# Patient Record
Sex: Female | Born: 1995 | Race: White | Hispanic: No | Marital: Single | Smoking: Never smoker
Health system: Southern US, Community
[De-identification: ages and names within clinical notes are randomized; demographics above are authoritative.]

## PROBLEM LIST (undated history)

## (undated) DIAGNOSIS — F99 Mental disorder, not otherwise specified: Secondary | ICD-10-CM

## (undated) DIAGNOSIS — N189 Chronic kidney disease, unspecified: Secondary | ICD-10-CM

## (undated) HISTORY — DX: Chronic kidney disease, unspecified: N18.9

## (undated) HISTORY — DX: Mental disorder, not otherwise specified: F99

---
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
## 2016-08-30 ENCOUNTER — Ambulatory Visit: Payer: Self-pay | Admitting: Advanced Practice Midwife

## 2016-08-30 ENCOUNTER — Encounter: Payer: Self-pay | Admitting: Advanced Practice Midwife

## 2016-08-30 VITALS — BP 138/82 | HR 80 | Wt 158.0 lb

## 2016-08-30 DIAGNOSIS — N926 Irregular menstruation, unspecified: Secondary | ICD-10-CM | POA: Diagnosis not present

## 2016-08-30 DIAGNOSIS — Z113 Encounter for screening for infections with a predominantly sexual mode of transmission: Secondary | ICD-10-CM | POA: Diagnosis not present

## 2016-08-30 DIAGNOSIS — Z3202 Encounter for pregnancy test, result negative: Secondary | ICD-10-CM | POA: Diagnosis not present

## 2016-08-30 LAB — POCT URINE PREGNANCY: PREG TEST UR: NEGATIVE

## 2016-08-30 MED ORDER — MEDROXYPROGESTERONE ACETATE 10 MG PO TABS: 10.0000 mg | ORAL_TABLET | Freq: Every day | ORAL | 0 refills | Status: DC

## 2016-08-30 MED ORDER — NORETHIN-ETH ESTRAD-FE BIPHAS 1 MG-10 MCG / 10 MCG PO THPK: 1.0000 | PACK | Freq: Every day | ORAL | 11 refills | Status: DC

## 2016-08-30 NOTE — Progress Notes (Signed)
[facility] Visit  Patient name: Daisy BoehringerSavannah R Wisenbaker MRN [id number]  Date of birth: 1996-02-11  CC & HPI:  Daisy Perkins is a 20 y.o. Caucasian female presenting today for  Period management  Started LoLoestrin in July (for irregular periods) (w/PCP--had "lots of bloodwork, all normal)  only took it for 3 months because on the third month she did not have a withdrawal bleed and got scared.  Last bleeding was 5 weeks ago. Has not had sex in 3 months. Wants to go back on pill, but has new insurance and LoLoestrin is not on formulary. Offered Pg challenge to jumpstart period and pt accepts.   Pertinent History Reviewed:  Medical & Surgical Hx:   Past Medical History:  Diagnosis Date  . Chronic kidney disease   . Mental disorder    anxiety   History reviewed. No pertinent surgical history. History reviewed. No pertinent family history.  Current Outpatient Prescriptions:  .  escitalopram (LEXAPRO) 10 MG tablet, Take 10 mg by mouth daily., Disp: , Rfl:  .  medroxyPROGESTERone (PROVERA) 10 MG tablet, Take 1 tablet (10 mg total) by mouth daily., Disp: 5 tablet, Rfl: 0 .  Norethin-Eth Estrad-Fe Biphas 1 MG-10 MCG / 10 MCG THPK, Take 1 tablet by mouth daily., Disp: 28 each, Rfl: 11 Social History: Reviewed -  reports that she has never smoked. She has never used smokeless tobacco.  Review of Systems:    Constitutional: Negative for fever and chills Eyes: Negative for visual disturbances Respiratory: Negative for shortness of breath, dyspnea Cardiovascular: Negative for chest pain or palpitations  Gastrointestinal: Negative for vomiting, diarrhea and constipation; no abdominal pain Genitourinary: Negative for dysuria and urgency, vaginal irritation or itching Musculoskeletal: Negative for back pain, joint pain, myalgias  Neurological: Negative for dizziness and headaches    Objective Findings:  Vitals: BP 138/82 (BP Location: Right Arm, Patient Position: Sitting, Cuff Size:  Normal)   Pulse 80   Wt 158 lb (71.7 kg)   LMP 07/23/2016   Physical Examination: General appearance - well appearing, and in no distress Mental status - alert, oriented to person, place, and time Chest:  Normal respiratory effort Heart - normal rate and regular rhythm Abdomen:  Soft, nontender Musculoskeletal:  Normal range of motion without pain Extremities:  No edema  No results found for this or any previous visit (from the past 24 hour(s)).        Assessment & Plan:  A:   Irregular menses P:  Provera 10mg  X 5; start COC w/bleed.   Meds ordered this encounter  Medications  . DISCONTD: Norethindrone-Ethinyl Estradiol-Fe Biphas (LO LOESTRIN FE) 1 MG-10 MCG / 10 MCG tablet    Sig: Take 1 tablet by mouth daily.  Marland Kitchen. escitalopram (LEXAPRO) 10 MG tablet    Sig: Take 10 mg by mouth daily.  Kathrynn Running. Norethin-Eth Estrad-Fe Biphas 1 MG-10 MCG / 10 MCG THPK    Sig: Take 1 tablet by mouth daily.    Dispense:  28 each    Refill:  11    Order Specific Question:   Supervising Provider    Answer:   Despina HiddenEURE, LUTHER H [[id number]]  . medroxyPROGESTERone (PROVERA) 10 MG tablet    Sig: Take 1 tablet (10 mg total) by mouth daily.    Dispense:  5 tablet    Refill:  0    Order Specific Question:   Supervising Provider    Answer:   Duane LopeEURE, LUTHER H [[id number]]    check CHL/GC  Return in about 3 months (around 11/30/2016) for med check.   CRESENZO-DISHMAN,Antonella Upson CNM 08/30/2016 4:12 PM

## 2016-08-31 LAB — GC/CHLAMYDIA PROBE AMP
CHLAMYDIA, DNA PROBE: NEGATIVE
NEISSERIA GONORRHOEAE BY PCR: NEGATIVE

## 2016-11-10 ENCOUNTER — Other Ambulatory Visit: Payer: Self-pay | Admitting: *Deleted

## 2016-11-13 ENCOUNTER — Other Ambulatory Visit: Payer: Self-pay | Admitting: *Deleted

## 2016-11-15 ENCOUNTER — Other Ambulatory Visit: Payer: Self-pay | Admitting: Advanced Practice Midwife

## 2016-11-15 MED ORDER — NORETHIN-ETH ESTRAD-FE BIPHAS 1 MG-10 MCG / 10 MCG PO THPK: 1.0000 | PACK | Freq: Every day | ORAL | 11 refills | Status: DC

## 2016-11-15 MED ORDER — NORETHIN-ETH ESTRAD-FE BIPHAS 1 MG-10 MCG / 10 MCG PO TABS: 1.0000 | ORAL_TABLET | Freq: Every day | ORAL | 3 refills | Status: DC

## 2016-11-23 ENCOUNTER — Encounter: Payer: Self-pay | Admitting: Advanced Practice Midwife

## 2016-11-30 ENCOUNTER — Ambulatory Visit: Payer: Managed Care, Other (non HMO) | Admitting: Advanced Practice Midwife

## 2016-12-07 ENCOUNTER — Ambulatory Visit: Payer: Managed Care, Other (non HMO) | Admitting: Advanced Practice Midwife

## 2017-04-02 ENCOUNTER — Encounter: Payer: Self-pay | Admitting: Advanced Practice Midwife

## 2017-04-10 ENCOUNTER — Ambulatory Visit (INDEPENDENT_AMBULATORY_CARE_PROVIDER_SITE_OTHER): Payer: Commercial Managed Care - PPO | Admitting: Advanced Practice Midwife

## 2017-04-10 ENCOUNTER — Encounter: Payer: Self-pay | Admitting: Advanced Practice Midwife

## 2017-04-10 VITALS — BP 120/72 | HR 82 | Ht 64.0 in | Wt 151.0 lb

## 2017-04-10 DIAGNOSIS — N921 Excessive and frequent menstruation with irregular cycle: Secondary | ICD-10-CM

## 2017-04-10 MED ORDER — NORGESTIM-ETH ESTRAD TRIPHASIC 0.18/0.215/0.25 MG-25 MCG PO TABS: 1.0000 | ORAL_TABLET | Freq: Every day | ORAL | 3 refills | Status: DC

## 2017-04-10 NOTE — Progress Notes (Signed)
[facility] Visit  Patient name: Daisy Perkins MRN [id number]  Date of birth: October 23, 1996  CC & HPI:  Daisy Perkins is a 21 y.o.  female presenting today for bleeding after intercourse.  She also has bleeding in between placebo pills, spotting.  No change in discharge On LoLoestrin Pertinent History Reviewed:  Medical & Surgical Hx:   Past Medical History:  Diagnosis Date  . Chronic kidney disease   . Mental disorder    anxiety   History reviewed. No pertinent surgical history. Family History  Problem Relation Age of Onset  . Cancer Paternal Grandfather        lung  . Cancer Paternal Grandmother        rectal  . Cancer Maternal Grandmother        ovarian  . Cancer Maternal Grandfather        lung  . Hypertension Mother     Current Outpatient Prescriptions:  .  ALPRAZolam (XANAX) 0.25 MG tablet, TAKE 1 TABLET BY MOUTH ONCE DAILY AS NEEDED FOR PANIC ATTACKS, Disp: , Rfl: 0 .  amphetamine-dextroamphetamine (ADDERALL) 10 MG tablet, Take 10 mg by mouth 2 (two) times daily., Disp: , Rfl: 0 .  escitalopram (LEXAPRO) 10 MG tablet, Take 10 mg by mouth daily., Disp: , Rfl:  .  Norgestimate-Ethinyl Estradiol Triphasic 0.18/0.215/0.25 MG-25 MCG tab, Take 1 tablet by mouth daily., Disp: 3 Package, Rfl: 3 Social History: Reviewed -  reports that she has never smoked. She has never used smokeless tobacco.  Review of Systems:   Constitutional: Negative for fever and chills Eyes: Negative for visual disturbances Respiratory: Negative for shortness of breath, dyspnea Cardiovascular: Negative for chest pain or palpitations  Gastrointestinal: Negative for vomiting, diarrhea and constipation; no abdominal pain Genitourinary: Negative for dysuria and urgency, vaginal irritation or itching Musculoskeletal: Negative for back pain, joint pain, myalgias  Neurological: Negative for dizziness and headaches    Objective Findings:    Physical Examination: General appearance -  well appearing, and in no distress Mental status - alert, oriented to person, place, and time Chest:  Normal respiratory effort Heart - normal rate and regular rhythm Abdomen:  Soft, nontender Pelvic: SSE:  Menstrual appearing blood.  Cx non firable.  Wet prep neg Musculoskeletal:  Normal range of motion without pain Extremities:  No edema    No results found for this or any previous visit (from the past 24 hour(s)).    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  CRESENZO-DISHMAN,Ancelmo Hunt CNM 04/10/2017 9:13 AM

## 2017-11-30 ENCOUNTER — Other Ambulatory Visit: Payer: Self-pay | Admitting: *Deleted

## 2017-12-04 MED ORDER — NORGESTIM-ETH ESTRAD TRIPHASIC 0.18/0.215/0.25 MG-25 MCG PO TABS: 1.0000 | ORAL_TABLET | Freq: Every day | ORAL | 3 refills | Status: DC

## 2018-02-13 ENCOUNTER — Other Ambulatory Visit: Payer: Commercial Managed Care - PPO | Admitting: Advanced Practice Midwife

## 2018-04-10 ENCOUNTER — Telehealth: Payer: Self-pay | Admitting: *Deleted

## 2018-04-10 NOTE — Telephone Encounter (Signed)
LMOVM

## 2018-04-11 NOTE — Telephone Encounter (Signed)
LMOVM Returning patient's call.  

## 2018-04-19 ENCOUNTER — Telehealth: Payer: Self-pay | Admitting: *Deleted

## 2018-04-19 NOTE — Telephone Encounter (Signed)
Pt having BTB with OCs, continue those for now and make appt for pap and physical in July and can discuss possibly changing pills then

## 2018-04-19 NOTE — Telephone Encounter (Signed)
Pt requesting a prescription for a new type of birth control pill. Her current one causes her to bleed when she is not supposed to. She is requesting a 90 day rx because of her insurance. She is finishing up her current pack and would like to start a new pill when it is time for it. Advised patient I will sent message to a provider and let her know.

## 2018-05-14 ENCOUNTER — Encounter: Payer: Self-pay | Admitting: Adult Health

## 2018-05-14 ENCOUNTER — Ambulatory Visit (INDEPENDENT_AMBULATORY_CARE_PROVIDER_SITE_OTHER): Payer: Commercial Managed Care - PPO | Admitting: Adult Health

## 2018-05-14 ENCOUNTER — Other Ambulatory Visit (HOSPITAL_COMMUNITY)
Admission: RE | Admit: 2018-05-14 | Discharge: 2018-05-14 | Disposition: A | Discharge status: Home / Self Care | Payer: Commercial Managed Care - PPO | Source: Ambulatory Visit | Attending: Adult Health | Admitting: Adult Health

## 2018-05-14 VITALS — BP 140/90 | HR 102 | Ht 63.25 in | Wt 148.5 lb

## 2018-05-14 DIAGNOSIS — N926 Irregular menstruation, unspecified: Secondary | ICD-10-CM

## 2018-05-14 DIAGNOSIS — Z01419 Encounter for gynecological examination (general) (routine) without abnormal findings: Secondary | ICD-10-CM | POA: Diagnosis not present

## 2018-05-14 DIAGNOSIS — Z3041 Encounter for surveillance of contraceptive pills: Secondary | ICD-10-CM

## 2018-05-14 DIAGNOSIS — Z01411 Encounter for gynecological examination (general) (routine) with abnormal findings: Secondary | ICD-10-CM | POA: Insufficient documentation

## 2018-05-14 MED ORDER — NORETHIN ACE-ETH ESTRAD-FE 1-20 MG-MCG(24) PO CAPS: 1.0000 | ORAL_CAPSULE | Freq: Every day | ORAL | 0 refills | Status: DC

## 2018-05-14 NOTE — Progress Notes (Addendum)
Patient ID: Daisy Perkins, female   DOB: 08-27-1996, 22 y.o.   MRN: [id number] History of Present Illness: Daisy Perkins is a 22 year old white female in for a well woman gyn exam and first pap. [facility]CP [facility].    Current Medications, Allergies, Past Medical History, Past Surgical History, Family History and Social History were reviewed in [facility] electronic medical record.     Review of Systems: Patient denies any headaches, hearing loss, fatigue, blurred vision, shortness of breath, chest pain, abdominal pain, problems with bowel movements, urination, or intercourse. No joint pain or mood swings.Has anxiety and is on lexapro, and xanax  +BTB,+cramps with period too. Has tried to use tampons and after insertion, when walking, has fainted.    Physical Exam:BP (!) 142/91 (BP Location: Right Arm, Patient Position: Sitting, Cuff Size: Normal)   Pulse (!) 102   Ht 5' 3.25" (1.607 m)   Wt 148 lb 8 oz (67.4 kg)   LMP 05/07/2018   BMI 26.10 kg/m BP 140/90 on recheck  General:  Well developed, well nourished, no acute distress Skin:  Warm and dry Neck:  Midline trachea, normal thyroid, good ROM, no lymphadenopathy Lungs; Clear to auscultation bilaterally Breast:  No dominant palpable mass, retraction, or nipple discharge Cardiovascular: Regular rate and rhythm Abdomen:  Soft, non tender, no hepatosplenomegaly Pelvic:  External genitalia is normal in appearance, no lesions.  The vagina is normal in appearance. Urethra has no lesions or masses. The cervix is smooth, pap with GC/CHL performed.  Uterus is felt to be normal size, shape, and contour.  No adnexal masses or tenderness noted.Bladder is non tender, no masses felt. Extremities/musculoskeletal:  No swelling or varicosities noted, no clubbing or cyanosis Psych:  No mood changes, alert and cooperative,seems happy PHQ 2 score 0.   Impression:  1. Encounter for gynecological examination with Papanicolaou smear of cervix   2.  Irregular bleeding   3. Encounter for surveillance of contraceptive pills      Plan: Start [place] today, and use condoms Meds ordered this encounter  Medications  . Norethin Ace-Eth Estrad-FE (TAYTULLA) 1-20 MG-MCG(24) CAPS    Sig: Take 1 tablet by mouth daily.    Dispense:  84 capsule    Refill:  0    Order Specific Question:   Supervising Provider    Answer:   Despina HiddenEURE, LUTHER H [[id number]]  F/U in 10 weeks  Physical in 1 year Pap in 3 if normal

## 2018-05-15 LAB — CYTOLOGY - PAP
CHLAMYDIA, DNA PROBE: NEGATIVE
Diagnosis: NEGATIVE
NEISSERIA GONORRHEA: NEGATIVE

## 2018-07-23 ENCOUNTER — Ambulatory Visit: Payer: Commercial Managed Care - PPO | Admitting: Adult Health

## 2018-08-26 ENCOUNTER — Telehealth: Payer: Self-pay | Admitting: Adult Health

## 2018-08-26 MED ORDER — NORETHIN ACE-ETH ESTRAD-FE 1-20 MG-MCG(24) PO CAPS: 1.0000 | ORAL_CAPSULE | Freq: Every day | ORAL | 0 refills | Status: DC

## 2018-08-26 NOTE — Telephone Encounter (Signed)
Patient called, stated that she ran out of her Taytulla yesterday.  She scheduled an appointment with Victorino Dike for 09/24/18.  She is requesting some Taytulla to get her through to her appointment.  [facility] [place]  [phone]

## 2018-08-26 NOTE — Telephone Encounter (Signed)
Refill taytulla  

## 2018-08-28 ENCOUNTER — Other Ambulatory Visit: Payer: Self-pay | Admitting: Adult Health

## 2018-08-28 MED ORDER — NORETHIN ACE-ETH ESTRAD-FE 1-20 MG-MCG(24) PO CAPS: 1.0000 | ORAL_CAPSULE | Freq: Every day | ORAL | 0 refills | Status: DC

## 2018-08-28 NOTE — Progress Notes (Signed)
Refill taytulla  

## 2018-09-24 ENCOUNTER — Other Ambulatory Visit: Payer: Commercial Managed Care - PPO | Admitting: Adult Health

## 2018-10-29 ENCOUNTER — Other Ambulatory Visit: Payer: Self-pay | Admitting: Adult Health

## 2018-10-30 ENCOUNTER — Other Ambulatory Visit: Payer: Self-pay | Admitting: Adult Health

## 2018-11-08 ENCOUNTER — Telehealth: Payer: Self-pay | Admitting: Adult Health

## 2018-11-08 NOTE — Telephone Encounter (Signed)
Left message that I think pharmacy sent that for taytulla

## 2018-11-08 NOTE — Telephone Encounter (Signed)
Patient called, stated that Victorino DikeJennifer called in a bc that she's never had before and wants to know if Victorino DikeJennifer had a purpose behind it.    [facility] [place]  [phone]

## 2018-11-14 ENCOUNTER — Telehealth: Payer: Self-pay | Admitting: Adult Health

## 2018-11-14 MED ORDER — NORETHIN ACE-ETH ESTRAD-FE 1-20 MG-MCG(24) PO CAPS: 1.0000 | ORAL_CAPSULE | Freq: Every day | ORAL | 3 refills | Status: DC

## 2018-11-14 NOTE — Telephone Encounter (Signed)
Refill Taytulla

## 2018-11-14 NOTE — Telephone Encounter (Signed)
[facility] you please [facility] [place]

## 2019-07-23 ENCOUNTER — Other Ambulatory Visit: Payer: Self-pay | Admitting: Adult Health

## 2020-06-25 ENCOUNTER — Other Ambulatory Visit: Payer: Self-pay

## 2020-06-25 ENCOUNTER — Ambulatory Visit (HOSPITAL_COMMUNITY)
Admission: RE | Admit: 2020-06-25 | Discharge: 2020-06-25 | Disposition: A | Discharge status: Home / Self Care | Payer: Commercial Managed Care - PPO | Source: Ambulatory Visit | Attending: Internal Medicine | Admitting: Internal Medicine

## 2020-06-25 ENCOUNTER — Other Ambulatory Visit (HOSPITAL_COMMUNITY): Payer: Self-pay | Admitting: Internal Medicine

## 2020-06-25 DIAGNOSIS — M545 Low back pain, unspecified: Secondary | ICD-10-CM

## 2020-08-14 ENCOUNTER — Other Ambulatory Visit: Payer: Self-pay | Admitting: Adult Health

## 2020-11-07 ENCOUNTER — Other Ambulatory Visit: Payer: Self-pay | Admitting: Adult Health

## 2021-08-12 IMAGING — DX DG LUMBAR SPINE COMPLETE 4+V
5 series · 5 of 5 positions shown · non-contrast
Comparison: No pertinent prior exams are available for comparison.

CLINICAL DATA: Low back pain, unspecified back pain laterality,
unspecified chronicity, unspecified whether sciatica present.
Additional history provided by technologist: Patient reports pain in
lower back without known specific injury.

EXAM:
LUMBAR SPINE - COMPLETE 4+ VIEW

[l-spine ap]
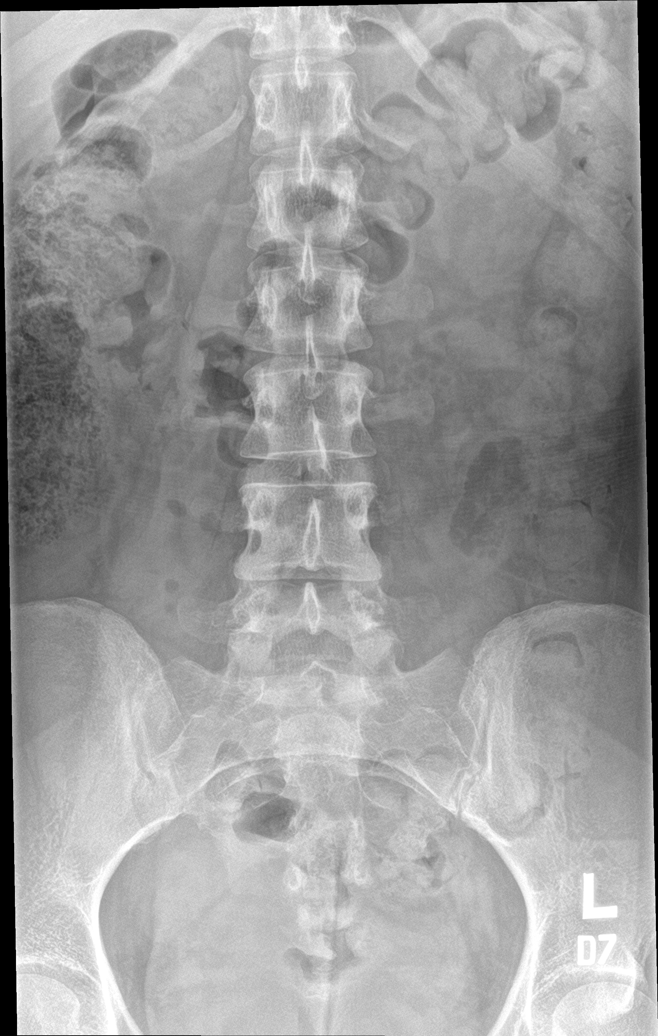

[l-spine obl (1 of 2)]
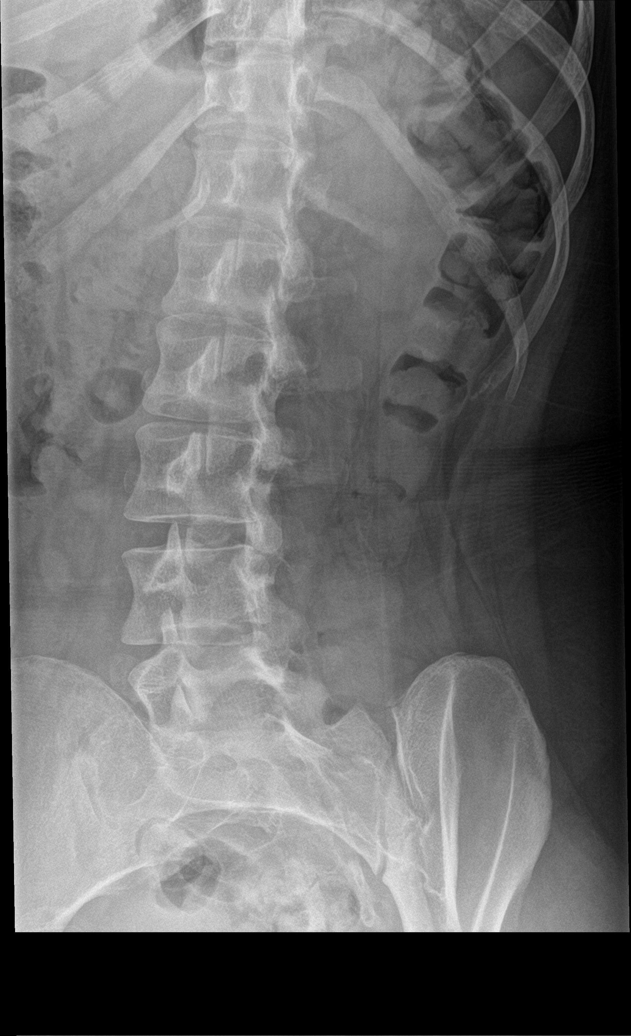

[l-spine obl (2 of 2)]
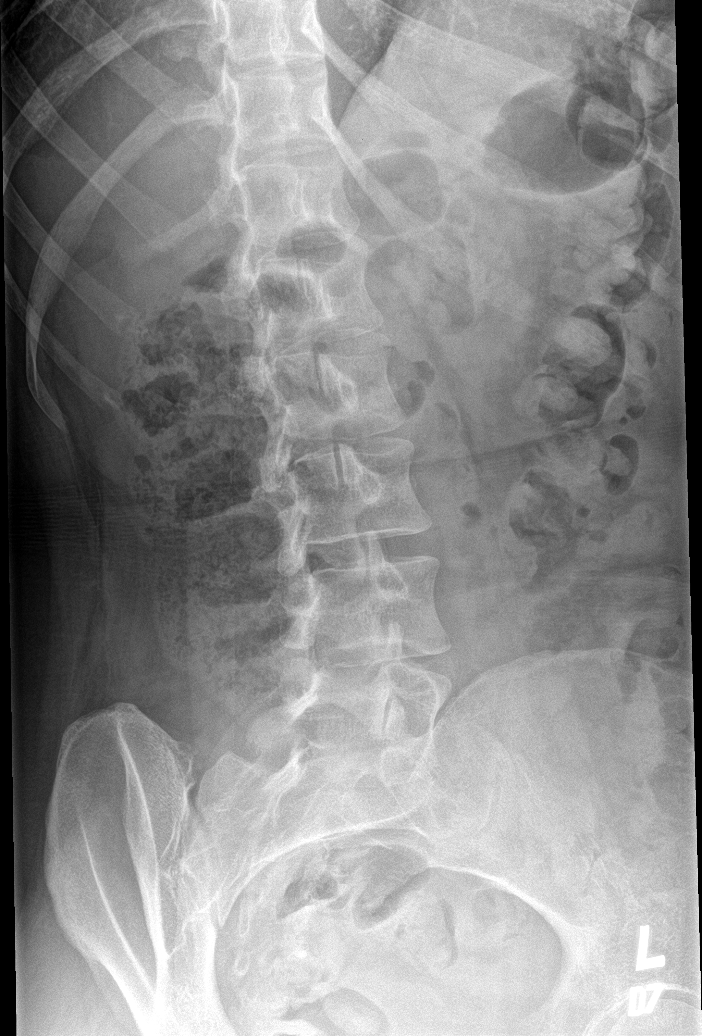

[l-spine lat]
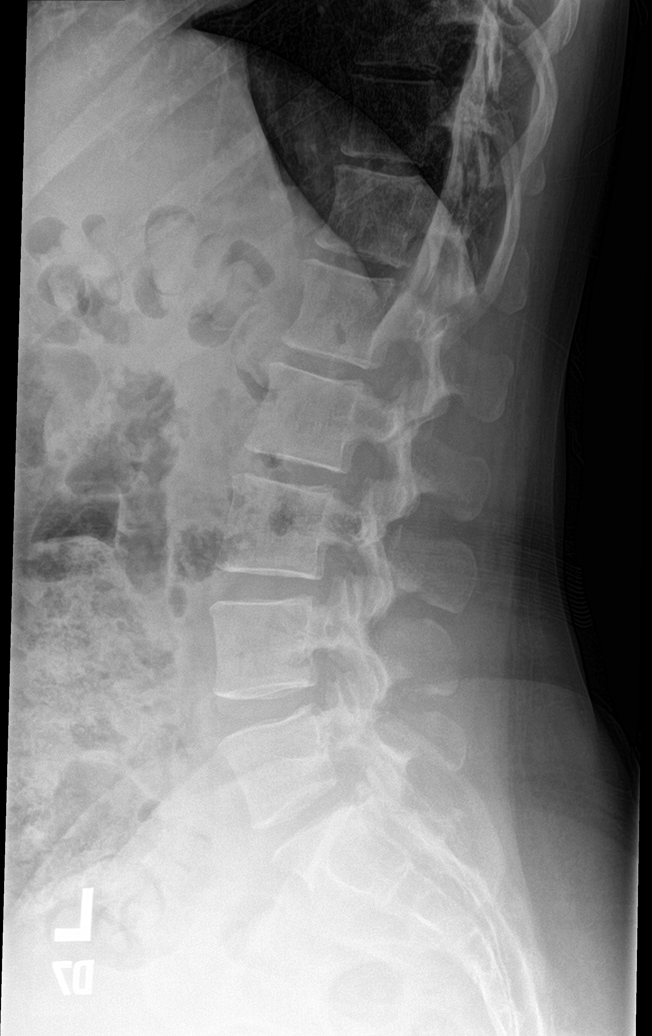

[l-spine spot]
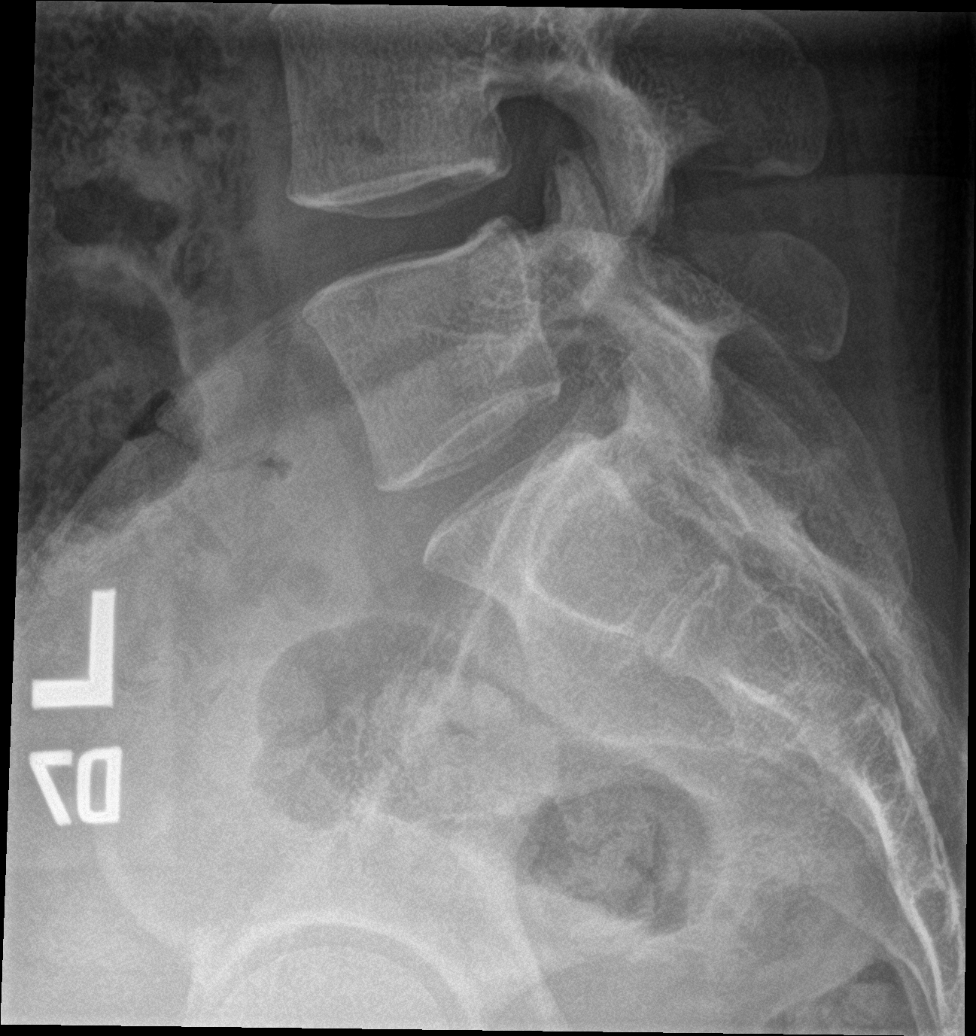

[5 of 5 positions shown; findings below may reference images not displayed]

FINDINGS: Five lumbar vertebrae. Rudimentary ribs are present at the T12
level.

Mild L1-L2, L2-L3, L3-L4 and L4-L5 grade 1 retrolisthesis.

Vertebral body height is maintained.

Intervertebral disc height is preserved.
IMPRESSION: Mild L1-L2, L2-L3, L3-L4 and L4-L5 grade 1 retrolisthesis.

No lumbar vertebral compression fracture is demonstrated.

Intervertebral disc height is maintained.
